# Patient Record
Sex: Male | Born: 1980 | Race: White | Hispanic: No | Marital: Married
Health system: Southern US, Community
[De-identification: ages and names within clinical notes are randomized; demographics above are authoritative.]

---

## 2006-06-05 ENCOUNTER — Emergency Department (HOSPITAL_COMMUNITY): Admission: EM | Admit: 2006-06-05 | Discharge: 2006-06-05 | Payer: Self-pay | Admitting: Emergency Medicine

## 2006-07-12 ENCOUNTER — Ambulatory Visit: Payer: Self-pay | Admitting: Cardiovascular Disease

## 2006-07-12 LAB — CONVERTED CEMR LAB
BUN: 20 mg/dL (ref 6–23)
Calcium: 9.5 mg/dL (ref 8.4–10.5)
GFR calc Af Amer: 103 mL/min
GFR calc non Af Amer: 85 mL/min
Glucose, Bld: 100 mg/dL — ABNORMAL HIGH (ref 70–99)

## 2009-03-20 ENCOUNTER — Emergency Department (HOSPITAL_COMMUNITY): Admission: EM | Admit: 2009-03-20 | Discharge: 2009-03-20 | Payer: Self-pay | Admitting: Emergency Medicine

## 2010-08-13 LAB — COMPREHENSIVE METABOLIC PANEL
ALT: 22 U/L (ref 0–53)
Albumin: 4.7 g/dL (ref 3.5–5.2)
Alkaline Phosphatase: 46 U/L (ref 39–117)
Glucose, Bld: 91 mg/dL (ref 70–99)
Potassium: 4.2 mEq/L (ref 3.5–5.1)
Sodium: 135 mEq/L (ref 135–145)
Total Protein: 7.8 g/dL (ref 6.0–8.3)

## 2010-08-13 LAB — POCT CARDIAC MARKERS: Troponin i, poc: <0.05 ng/mL (ref 0.00–0.09)

## 2010-08-13 LAB — CBC
Hemoglobin: 16.6 g/dL (ref 13.0–17.0)
RDW: 12 % (ref 11.5–15.5)
WBC: 4.2 10*3/uL (ref 4.0–10.5)

## 2011-01-19 ENCOUNTER — Ambulatory Visit: Payer: Self-pay

## 2011-01-19 ENCOUNTER — Other Ambulatory Visit: Payer: Self-pay | Admitting: Occupational Medicine

## 2011-01-19 DIAGNOSIS — Z021 Encounter for pre-employment examination: Secondary | ICD-10-CM

## 2012-03-29 IMAGING — CR DG CHEST 1V
1 series · 1 of 1 positions shown · non-contrast
Comparison: None.

CLINICAL DATA: 29-year-old male undergoing physical exam.

CHEST - 1 VIEW

[view not recorded]
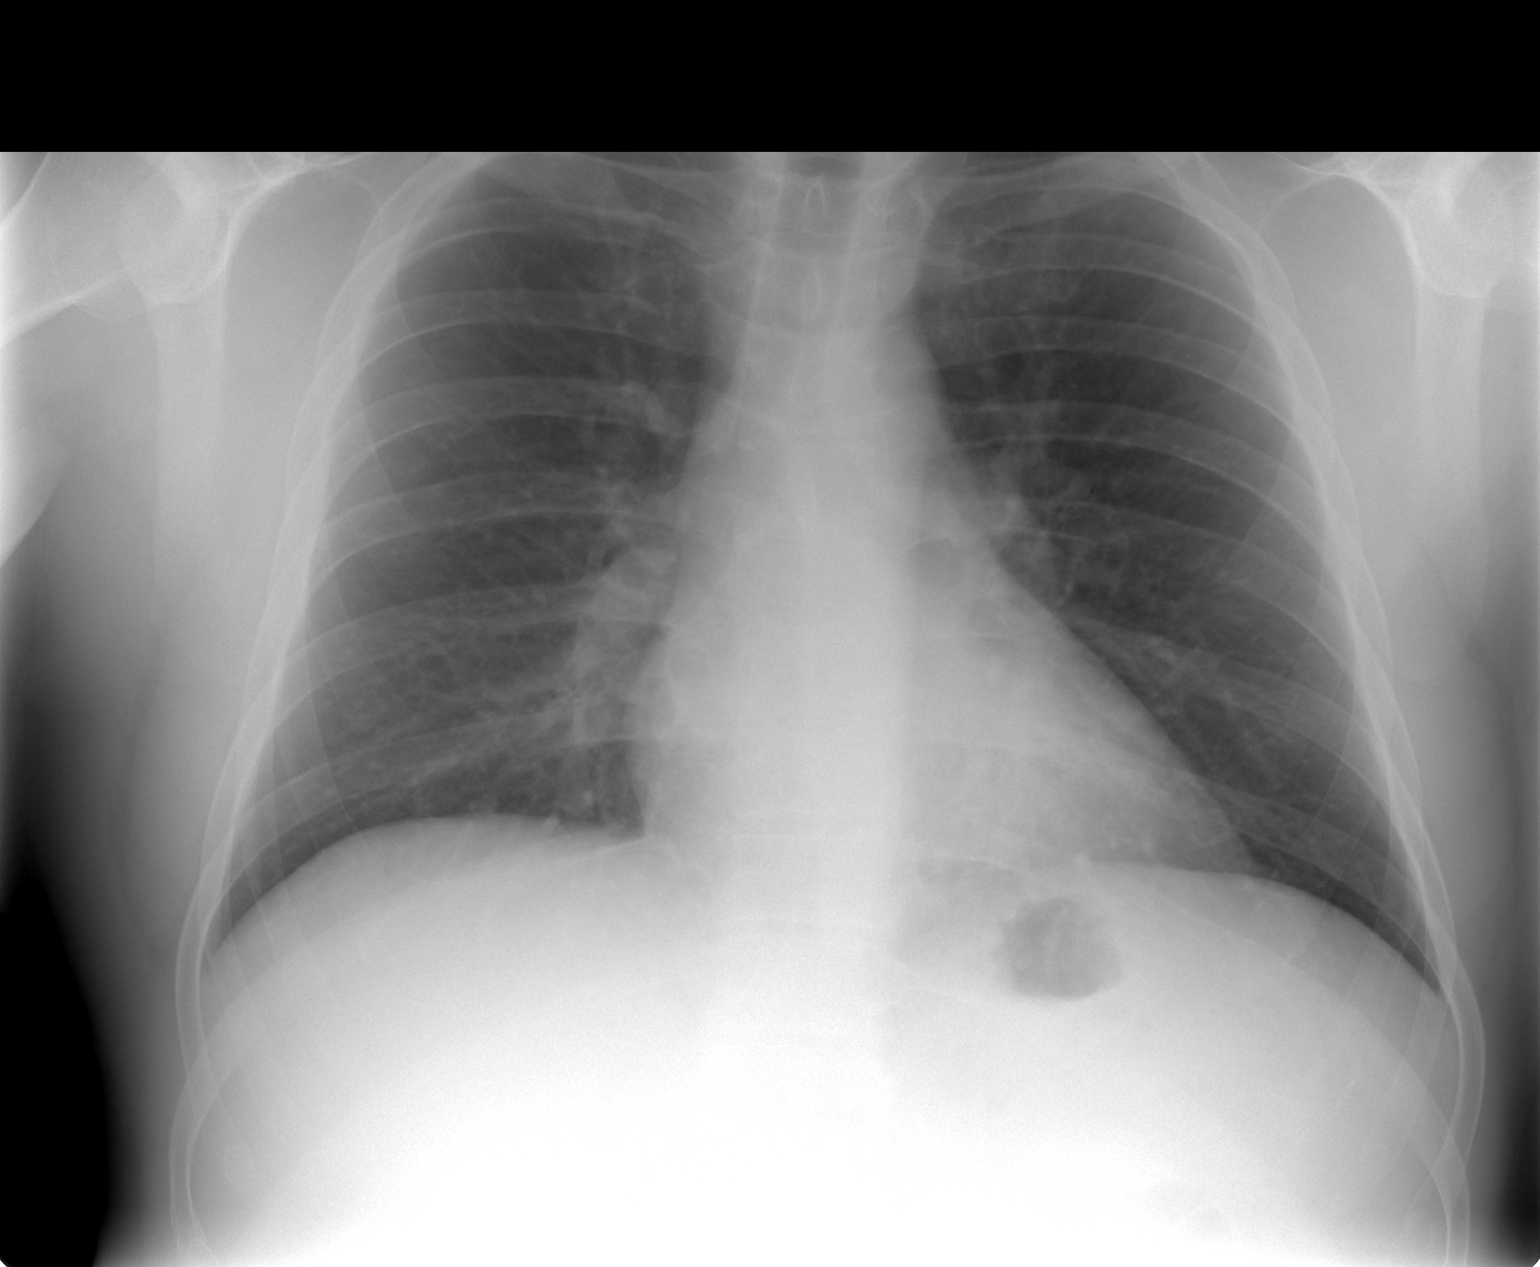

[1 of 1 positions shown; findings below may reference images not displayed]

FINDINGS: Normal cardiac size and mediastinal contours.  Visualized
tracheal air column is within normal limits.  The lungs are clear.
No pneumothorax or effusion. No osseous abnormality identified.
IMPRESSION: Negative, no acute cardiopulmonary abnormality.

## 2022-01-27 ENCOUNTER — Ambulatory Visit (HOSPITAL_COMMUNITY)
Admission: EM | Admit: 2022-01-27 | Discharge: 2022-01-27 | Disposition: A | Discharge status: Home / Self Care | Payer: No Payment, Other | Attending: Psychiatry | Admitting: Psychiatry

## 2022-01-27 DIAGNOSIS — F32A Depression, unspecified: Secondary | ICD-10-CM | POA: Insufficient documentation

## 2022-01-27 DIAGNOSIS — F419 Anxiety disorder, unspecified: Secondary | ICD-10-CM | POA: Insufficient documentation

## 2022-01-27 NOTE — ED Provider Notes (Signed)
[facility]al [facility] Medical Screening Exam  Patient Name: Devin Moore MRN: [id number] Date of Evaluation: 01/27/22 Chief Complaint:   Diagnosis:  Final diagnoses:  Anxiety disorder, unspecified type   History of Present illness: Devin Moore is a 41 y.o. male. Pt presents voluntarily to [facility] behavioral health for walk-in assessment.  Pt is accompanied by his wife, Devin Moore, who remains with pt throughout the assessment, per pt request and verbal consent. Pt is assessed face-to-face by nurse practitioner.   Mady Gemma, 41 y.o., male patient seen face to face by this provider, consulted with Dr. Viviano Simas; and chart reviewed on 01/27/22.  On evaluation Devin Moore reports he is presenting today because "for the past 60 days, haven't been sleeping, depressed, anxious, having panic attacks". Pt reports 60 days ago he lost his job, was working at [employer]. Per Devin Moore, the first 5 days following losing his job, pt stayed up all night, pacing, experiencing severe anxiety.  Pt reports at baseline his sleep is poor, although this has been worsened since losing his job. He is prescribed 25mg  of hydroxyzine by his primary care provider, and has been taking 3 or 4 pills, clearing this with his primary care provider, which he states is not helping. He states he was previously taking seroquel to help with sleep but it did not help, made him feel like his eyelids were in rapid eye movement while awake.  He reports poor appetite, has lost 30 lbs in the past 60 days.   He denies suicidal, homicidal, or violent ideations. He denies auditory visual hallucinations or paranoia.   He denies alcohol, marijuana, crack/cocaine or other substance use. He reports hx of alcohol use disorder. He reports he has not had a drink since June.   Family psychiatric history is positive. 2 aunts with anxiety. He states his mother carries diagnosis of vascular dementia.   Pt is living with his  wife.  He denies access to firearms or other weapons. He does own firearms although these have been removed from the home and are at his in-laws' home.   No history of inpatient psychiatric hospitalizations. No history of suicide attempts or non suicidal self injurious behavior.  Currently taking hydroxyzine. Previous medication trials of seroquel and lexapro. Pt states on lexapro he was triggered into a hypomanic phase. He was hyperverbal, increase in goal directed activity, sleeping less. Per July, pt was "extra generous" as well.   Pt is not currently connected with counseling. Receives medication management from primary care provider.  Discussed with pt admission to [facility] to start pt on medications, discussed possibility of starting abilify or zyprexa, which pt declined. Pt discharged with resources for outpatient medication management and counseling. Discussed alternatively following up with primary care as they are currently managing his psychiatric medications.  During evaluation Enrico Eaddy is sitting, in no acute distress. He is alert, oriented x 4, calm, cooperative and attentive.  His mood is anxious, depressed. Affect is blunt. He has normal speech, and behavior.  Objectively there is no evidence of psychosis/mania or delusional thinking.  Patient is able to converse coherently, goal directed thoughts, no distractibility, or pre-occupation.  He also denies suicidal, homicidal or violent ideation. He denies auditory visual hallucinations or paranoia. Patient answered questions appropriately.     Psychiatric Specialty Exam  Presentation  General Appearance:Appropriate for Environment; Casual; Fairly Groomed  Eye Contact:Fair  Speech:Clear and Coherent  Speech Volume:Normal  Handedness:No data recorded  Mood and Affect  Mood:Anxious;  Depressed Affect:Blunt  Thought Process  Thought Processes:Coherent; Goal Directed; Linear Descriptions of  Associations:Intact  Orientation:Full (Time, Place and Person)  Thought Content:Logical    Hallucinations:None  Ideas of Reference:None  Suicidal Thoughts:No  Homicidal Thoughts:No   Sensorium  Memory:Immediate Good Judgment:Good Insight:Good  Executive Functions  Concentration:Good Attention Span:Good Recall:Good Fund of Knowledge:Good Language:Good  Psychomotor Activity  Psychomotor Activity:Normal  Assets  Assets:Communication Skills; Desire for Improvement; Housing; Social Support; Physical Health  Sleep  Sleep:Poor Number of hours: No data recorded  No data recorded  Physical Exam: Physical Exam Constitutional:      Appearance: Normal appearance.  Cardiovascular:     Rate and Rhythm: Normal rate.  Pulmonary:     Effort: Pulmonary effort is normal.  Neurological:     Mental Status: He is alert and oriented to person, place, and time.  Psychiatric:        Attention and Perception: Attention and perception normal.        Mood and Affect: Mood is anxious and depressed. Affect is blunt.        Speech: Speech normal.        Behavior: Behavior normal. Behavior is cooperative.        Thought Content: Thought content normal.        Cognition and Memory: Cognition and memory normal.        Judgment: Judgment normal.    Review of Systems  Constitutional:  Negative for chills and fever.  Respiratory:  Negative for shortness of breath.   Cardiovascular:  Negative for chest pain and palpitations.  Neurological:  Negative for headaches.  Psychiatric/Behavioral:  Positive for depression. The patient is nervous/anxious and has insomnia.    Blood pressure (!) 125/90, pulse 77, temperature 98.6 F (37 C), temperature source Oral, resp. rate 18, SpO2 98 %. There is no height or weight on file to calculate BMI.  Musculoskeletal: Strength & Muscle Tone: within normal limits Gait & Station: normal Patient leans: N/A   [facility] Discharge Disposition for Follow  up and Recommendations: Based on my evaluation the patient does not appear to have an emergency medical condition and can be discharged with resources and follow up care in outpatient services for Medication Management and Individual Therapy   Tharon Aquas, NP 01/27/2022, 7:50 PM

## 2022-01-27 NOTE — ED Triage Notes (Signed)
Pt presents to [facility] accompanied by his wife seeking medication for anxiety. Pt states his PCP recommended being seen by a psychiatrist for medication management for his anxiety symptoms. Pt reports lack of sleep. Pt denies SI/HI and AVH.

## 2022-01-27 NOTE — Discharge Instructions (Addendum)
Please follow up with outpatient medication management and counseling.  Patient is instructed prior to discharge to: Take all medications as prescribed by his/her mental healthcare provider. Report any adverse effects and or reactions from the medicines to his/her outpatient provider promptly. Keep all scheduled appointments, to ensure that you are getting refills on time and to avoid any interruption in your medication.  If you are unable to keep an appointment call to reschedule.  Be sure to follow-up with resources and follow-up appointments provided.  Patient has been instructed & cautioned: To not engage in alcohol and or illegal drug use while on prescription medicines. In the event of worsening symptoms, patient is instructed to call the crisis hotline, 911 and or go to the nearest ED for appropriate evaluation and treatment of symptoms. To follow-up with his/her primary care provider for your other medical issues, concerns and or health care needs.

## 2022-01-29 ENCOUNTER — Telehealth (HOSPITAL_COMMUNITY): Payer: Self-pay | Admitting: Physician Assistant

## 2022-01-29 NOTE — BH Assessment (Addendum)
Care Management - [facility] Follow Up Discharges  ° °Writer attempted to make contact with patient today and was unsuccessful.  Writer left a HIPPA compliant voice message.  ° °Per chart review, patient was provided with outpatient resources. ° °
